# Patient Record
Sex: Male | Born: 2000 | Race: Black or African American | Hispanic: No | Marital: Single | Smoking: Light tobacco smoker
Health system: Southern US, Community
[De-identification: ages and names within clinical notes are randomized; demographics above are authoritative.]

---

## 2020-09-22 ENCOUNTER — Other Ambulatory Visit: Payer: Self-pay

## 2020-09-22 ENCOUNTER — Emergency Department (HOSPITAL_COMMUNITY)
Admission: EM | Admit: 2020-09-22 | Discharge: 2020-09-22 | Disposition: A | Discharge status: Home / Self Care | Payer: BC Managed Care – PPO | Attending: Emergency Medicine | Admitting: Emergency Medicine

## 2020-09-22 ENCOUNTER — Emergency Department (HOSPITAL_COMMUNITY): Payer: BC Managed Care – PPO

## 2020-09-22 ENCOUNTER — Encounter (HOSPITAL_COMMUNITY): Payer: Self-pay | Admitting: Emergency Medicine

## 2020-09-22 DIAGNOSIS — R0789 Other chest pain: Secondary | ICD-10-CM | POA: Diagnosis not present

## 2020-09-22 DIAGNOSIS — F17200 Nicotine dependence, unspecified, uncomplicated: Secondary | ICD-10-CM | POA: Insufficient documentation

## 2020-09-22 MED ORDER — METHOCARBAMOL 500 MG PO TABS: 500.0000 mg | ORAL_TABLET | Freq: Two times a day (BID) | ORAL | 0 refills | Status: AC

## 2020-09-22 MED ORDER — IBUPROFEN 200 MG PO TABS
600.0000 mg | ORAL_TABLET | Freq: Once | ORAL | Status: AC
  Administered 2020-09-22: 600 mg via ORAL
  Filled 2020-09-22: qty 3

## 2020-09-22 MED ORDER — ACETAMINOPHEN 325 MG PO TABS
650.0000 mg | ORAL_TABLET | Freq: Once | ORAL | Status: AC
  Administered 2020-09-22: 650 mg via ORAL
  Filled 2020-09-22: qty 2

## 2020-09-22 MED ORDER — NAPROXEN 500 MG PO TABS: 500.0000 mg | ORAL_TABLET | Freq: Two times a day (BID) | ORAL | 0 refills | Status: AC

## 2020-09-22 NOTE — ED Provider Notes (Signed)
MSE was initiated and I personally evaluated the patient and placed orders (if any) at  1:42 PM on September 22, 2020.  The patient appears stable so that the remainder of the MSE may be completed by another provider.  Patient presents with right-sided chest pain ongoing for 3 days described as aching sharp and nonradiating.  Worse when he stretches out his chest or takes a deep breath or moves a certain way.  Denies fevers cough vomiting or diarrhea.  Cardiac history or any other medical history per patient.  On exam patient is awake and alert no tenderness to right chest wall.  Pain elicited however when he twists his upper torso to the right or to the left.  No lower extremity swelling noted.     Cheryll Cockayne, MD 09/22/20 [phone]

## 2020-09-22 NOTE — ED Provider Notes (Signed)
[facility]-EMERGENCY DEPT Provider Note   CSN: [id number] Arrival date & time: 09/22/20  1302    History Chief Complaint  Patient presents with  . chest wall pain     Frank Richard is a 20 y.o. male with no significant past medical history who presents for evaluation of right sided chest wall pain. Began 3 days ago. Worse with movement, twisting, reaching out right arm. No recent head trauma.  Does not radiate into the back, neck or jaw.  He has no exertional chest pain.  Has not taken anything for his symptoms.  No swelling to chest wall, rashes or lesions.  Described as sharp and aching.  No cardiac history.  No syncope.  No fever, chills, nausea, vomiting, cough, hemoptysis, shortness of breath abdominal pain, paresthesias, weakness, lateral leg swelling, redness or warmth.  No prior history of PE or DVT.  No recent surgery, immobilization or malignancy.  Pain is not worse when he lays flat or sits up.  Rates his pain a 9/10.  Denies additional aggravating or alleviating factors.  History obtained from patient and past medical history. No interpretor was used.  HPI     History reviewed. No pertinent past medical history.  There are no problems to display for this patient.   History reviewed. No pertinent surgical history.     No family history on file.  Social History   Tobacco Use  . Smoking status: Light Tobacco Smoker  . Smokeless tobacco: Never Used  Substance Use Topics  . Alcohol use: Never  . Drug use: Yes    Types: Marijuana    Comment: daily    Home Medications Prior to Admission medications   Medication Sig Start Date End Date Taking? Authorizing Provider  methocarbamol (ROBAXIN) 500 MG tablet Take 1 tablet (500 mg total) by mouth 2 (two) times daily. 09/22/20  Yes Teasia Zapf A, PA-C  naproxen (NAPROSYN) 500 MG tablet Take 1 tablet (500 mg total) by mouth 2 (two) times daily. 09/22/20  Yes Darriel Utter A, PA-C     Allergies    Patient has no known allergies.  Review of Systems   Review of Systems  Constitutional: Negative.   HENT: Negative.   Respiratory: Negative.   Cardiovascular: Positive for chest pain. Negative for palpitations and leg swelling.  Gastrointestinal: Negative.   Genitourinary: Negative.   Musculoskeletal: Negative.   Skin: Negative.   Neurological: Negative.   All other systems reviewed and are negative.   Physical Exam Updated Vital Signs BP 118/75 (BP Location: Right Arm)   Pulse 69   Temp 98 F (36.7 C) (Oral)   Resp 18   Ht 5\' 9"  (1.753 m)   Wt 61.2 kg   SpO2 99%   BMI 19.94 kg/m   Physical Exam Vitals and nursing note reviewed.  Constitutional:      General: He is not in acute distress.    Appearance: He is not ill-appearing, toxic-appearing or diaphoretic.  HENT:     Head: Normocephalic and atraumatic.     Jaw: There is normal jaw occlusion.     Nose: Nose normal.     Mouth/Throat:     Mouth: Mucous membranes are moist.  Eyes:     Extraocular Movements: Extraocular movements intact.  Neck:     Vascular: No carotid bruit or JVD.     Trachea: Trachea and phonation normal.     Meningeal: Brudzinski's sign and Kernig's sign absent.  Cardiovascular:  Rate and Rhythm: Normal rate.     Pulses: Normal pulses.          Radial pulses are 2+ on the right side and 2+ on the left side.       Posterior tibial pulses are 2+ on the right side and 2+ on the left side.     Heart sounds: Normal heart sounds.  Pulmonary:     Effort: Pulmonary effort is normal.     Breath sounds: Normal breath sounds and air entry.     Comments: Clear to auscultation bilaterally.  Speaks in full sentences without difficulty. Chest:     Chest wall: No deformity, swelling, tenderness, crepitus or edema.  Abdominal:     General: Bowel sounds are normal. There is no distension.     Palpations: Abdomen is soft.     Tenderness: There is no abdominal tenderness. There is no  guarding or rebound.     Comments: Soft, nontender.  No rebound or guarding.  Musculoskeletal:        General: No swelling or tenderness. Normal range of motion.     Cervical back: Full passive range of motion without pain, normal range of motion and neck supple.     Right lower leg: No edema.     Left lower leg: No edema.     Comments: No bony tenderness to all 4 extremities.  Compartments soft.  Denna Haggard' sign negative.  Feet:     Right foot:     Skin integrity: Skin integrity normal.     Left foot:     Skin integrity: Skin integrity normal.  Skin:    General: Skin is warm.     Capillary Refill: Capillary refill takes less than 2 seconds.     Comments: No edema, erythema or warmth.  No fluctuance or induration.  Neurological:     General: No focal deficit present.     Mental Status: He is alert and oriented to person, place, and time.     Comments: Cranial nerves II through grossly intact Moves all 4 extremities at difficulty Ambulatory without difficulty     ED Results / Procedures / Treatments   Labs (all labs ordered are listed, but only abnormal results are displayed) Labs Reviewed - No data to display  EKG EKG Interpretation  Date/Time:  Wednesday September 22 2020 13:52:45 EDT Ventricular Rate:  57 PR Interval:  187 QRS Duration: 82 QT Interval:  383 QTC Calculation: 373 R Axis:   92 Text Interpretation: Sinus rhythm Borderline right axis deviation RSR' in V1 or V2, probably normal variant early repolarization, no old comparison Confirmed by Arby Barrette [phone]) on 09/22/2020 3:36:40 PM   Radiology DG Chest 2 View  Result Date: 09/22/2020 CLINICAL DATA:  Chest pain. EXAM: CHEST - 2 VIEW COMPARISON:  None. FINDINGS: The heart size and mediastinal contours are within normal limits. Both lungs are clear. No pneumothorax or pleural effusion is noted. The visualized skeletal structures are unremarkable. IMPRESSION: No active cardiopulmonary disease. Electronically Signed    By: Lupita Raider M.D.   On: 09/22/2020 15:06   Procedures Procedures   Medications Ordered in ED Medications  acetaminophen (TYLENOL) tablet 650 mg (650 mg Oral Given 09/22/20 1505)  ibuprofen (ADVIL) tablet 600 mg (600 mg Oral Given 09/22/20 1505)   ED Course  I have reviewed the triage vital signs and the nursing notes.  Pertinent labs & imaging results that were available during my care of the patient were reviewed by  me and considered in my medical decision making (see chart for details).  20 year old here for evaluation of right-sided chest pain over the last 3 days.  No recent injury or trauma.  He is afebrile, nonseptic, not ill-appearing.  Pain worse with movement.  Does not radiate into the arm, back, jaw.  He has no clinical evidence of DVT on exam.  He is PERC negative, Wells criteria low risk.  Low suspicion for acute ACS given this is a young healthy male with no cardiovascular risk factors.  Denies any PND, orthopnea, history of heart failure.  No history of drug use, specifically cocaine use.  He has no clinical evidence of fluid overload on exam.  He has no pain when he lies flat or sits forward.  Low suspicion for acute pericarditis.   Dg chest without acute cardiomegaly, pulm edema, pneumothorax, infiltrates EKG with likely early repolarization  Patient given Tylenol and ibuprofen here with significant relief of pain. I am able to reproduce his pain on exam.  Likely musculoskeletal in etiology.  He has no overlying skin changes to suggest cellulitis or abscess.  Low suspicion for PE, dissection.  DC home with symptomatic management.  He will return for any worsening symptoms  The patient has been appropriately medically screened and/or stabilized in the ED. I have low suspicion for any other emergent medical condition which would require further screening, evaluation or treatment in the ED or require inpatient management.  Patient is hemodynamically stable and in no  acute distress.  Patient able to ambulate in department prior to ED.  Evaluation does not show acute pathology that would require ongoing or additional emergent interventions while in the emergency department or further inpatient treatment.  I have discussed the diagnosis with the patient and answered all questions.  Pain is been managed while in the emergency department and patient has no further complaints prior to discharge.  Patient is comfortable with plan discussed in room and is stable for discharge at this time.  I have discussed strict return precautions for returning to the emergency department.  Patient was encouraged to follow-up with PCP/specialist refer to at discharge.       MDM Rules/Calculators/A&P                           Final Clinical Impression(s) / ED Diagnoses Final diagnoses:  Chest wall pain    Rx / DC Orders ED Discharge Orders         Ordered    naproxen (NAPROSYN) 500 MG tablet  2 times daily        09/22/20 1541    methocarbamol (ROBAXIN) 500 MG tablet  2 times daily        09/22/20 1541           Ustin Cruickshank A, PA-C 09/22/20 1553    Arby Barrette, MD 09/22/20 [phone]

## 2020-09-22 NOTE — Discharge Instructions (Signed)
Take the medications as prescribed  Return for new or worsening symptoms 

## 2020-09-22 NOTE — ED Triage Notes (Signed)
Patient complains of R sided chest wall pain, endorses pain around his pectoral muscle with arm movement. Denies recent trauma, lifting, car wrecks, denies shortness of breath cough or chest pain.

## 2020-09-25 ENCOUNTER — Emergency Department (HOSPITAL_COMMUNITY)
Admission: EM | Admit: 2020-09-25 | Discharge: 2020-09-25 | Disposition: A | Discharge status: Home / Self Care | Payer: BC Managed Care – PPO | Attending: Emergency Medicine | Admitting: Emergency Medicine

## 2020-09-25 ENCOUNTER — Encounter (HOSPITAL_COMMUNITY): Payer: Self-pay

## 2020-09-25 ENCOUNTER — Emergency Department (HOSPITAL_COMMUNITY)
Admission: EM | Admit: 2020-09-25 | Discharge: 2020-09-25 | Disposition: A | Discharge status: Home / Self Care | Payer: BC Managed Care – PPO | Source: Home / Self Care | Attending: Emergency Medicine | Admitting: Emergency Medicine

## 2020-09-25 ENCOUNTER — Encounter (HOSPITAL_COMMUNITY): Payer: Self-pay | Admitting: Emergency Medicine

## 2020-09-25 ENCOUNTER — Other Ambulatory Visit: Payer: Self-pay

## 2020-09-25 ENCOUNTER — Emergency Department (HOSPITAL_COMMUNITY): Payer: BC Managed Care – PPO

## 2020-09-25 DIAGNOSIS — R079 Chest pain, unspecified: Secondary | ICD-10-CM | POA: Insufficient documentation

## 2020-09-25 DIAGNOSIS — Z20822 Contact with and (suspected) exposure to covid-19: Secondary | ICD-10-CM | POA: Insufficient documentation

## 2020-09-25 DIAGNOSIS — Z28310 Unvaccinated for covid-19: Secondary | ICD-10-CM | POA: Diagnosis not present

## 2020-09-25 DIAGNOSIS — R519 Headache, unspecified: Secondary | ICD-10-CM | POA: Insufficient documentation

## 2020-09-25 DIAGNOSIS — R059 Cough, unspecified: Secondary | ICD-10-CM | POA: Insufficient documentation

## 2020-09-25 DIAGNOSIS — R1013 Epigastric pain: Secondary | ICD-10-CM | POA: Diagnosis not present

## 2020-09-25 DIAGNOSIS — D72829 Elevated white blood cell count, unspecified: Secondary | ICD-10-CM | POA: Insufficient documentation

## 2020-09-25 DIAGNOSIS — M791 Myalgia, unspecified site: Secondary | ICD-10-CM | POA: Insufficient documentation

## 2020-09-25 DIAGNOSIS — J020 Streptococcal pharyngitis: Secondary | ICD-10-CM | POA: Insufficient documentation

## 2020-09-25 DIAGNOSIS — F17200 Nicotine dependence, unspecified, uncomplicated: Secondary | ICD-10-CM | POA: Insufficient documentation

## 2020-09-25 DIAGNOSIS — J029 Acute pharyngitis, unspecified: Secondary | ICD-10-CM | POA: Insufficient documentation

## 2020-09-25 DIAGNOSIS — R52 Pain, unspecified: Secondary | ICD-10-CM

## 2020-09-25 DIAGNOSIS — K920 Hematemesis: Secondary | ICD-10-CM | POA: Diagnosis not present

## 2020-09-25 DIAGNOSIS — E876 Hypokalemia: Secondary | ICD-10-CM | POA: Diagnosis not present

## 2020-09-25 DIAGNOSIS — R070 Pain in throat: Secondary | ICD-10-CM | POA: Diagnosis present

## 2020-09-25 LAB — CBC WITH DIFFERENTIAL/PLATELET
Abs Immature Granulocytes: 0.03 10*3/uL (ref 0.00–0.07)
Abs Immature Granulocytes: 0.06 10*3/uL (ref 0.00–0.07)
Basophils Absolute: 0 10*3/uL (ref 0.0–0.1)
Basophils Absolute: 0 10*3/uL (ref 0.0–0.1)
Basophils Relative: 0 %
Basophils Relative: 0 %
Eosinophils Absolute: 0 10*3/uL (ref 0.0–0.5)
Eosinophils Absolute: 0 10*3/uL (ref 0.0–0.5)
Eosinophils Relative: 0 %
Eosinophils Relative: 0 %
HCT: 39.8 % (ref 39.0–52.0)
HCT: 37 % — ABNORMAL LOW (ref 39.0–52.0)
Hemoglobin: 13.3 g/dL (ref 13.0–17.0)
Hemoglobin: 12.7 g/dL — ABNORMAL LOW (ref 13.0–17.0)
Immature Granulocytes: 0 %
Immature Granulocytes: 0 %
Lymphocytes Relative: 5 %
Lymphocytes Relative: 3 %
Lymphs Abs: 0.5 10*3/uL — ABNORMAL LOW (ref 0.7–4.0)
Lymphs Abs: 0.4 10*3/uL — ABNORMAL LOW (ref 0.7–4.0)
MCH: 29.1 pg (ref 26.0–34.0)
MCH: 29.4 pg (ref 26.0–34.0)
MCHC: 33.4 g/dL (ref 30.0–36.0)
MCHC: 34.3 g/dL (ref 30.0–36.0)
MCV: 87.1 fL (ref 80.0–100.0)
MCV: 85.6 fL (ref 80.0–100.0)
Monocytes Absolute: 0.8 10*3/uL (ref 0.1–1.0)
Monocytes Absolute: 1 10*3/uL (ref 0.1–1.0)
Monocytes Relative: 8 %
Monocytes Relative: 7 %
Neutro Abs: 9 10*3/uL — ABNORMAL HIGH (ref 1.7–7.7)
Neutro Abs: 12.1 10*3/uL — ABNORMAL HIGH (ref 1.7–7.7)
Neutrophils Relative %: 87 %
Neutrophils Relative %: 90 %
Platelets: 213 10*3/uL (ref 150–400)
Platelets: 188 10*3/uL (ref 150–400)
RBC: 4.57 MIL/uL (ref 4.22–5.81)
RBC: 4.32 MIL/uL (ref 4.22–5.81)
RDW: 12.7 % (ref 11.5–15.5)
RDW: 12.4 % (ref 11.5–15.5)
WBC: 10.4 10*3/uL (ref 4.0–10.5)
WBC: 13.5 10*3/uL — ABNORMAL HIGH (ref 4.0–10.5)
nRBC: 0 % (ref 0.0–0.2)
nRBC: 0 % (ref 0.0–0.2)

## 2020-09-25 LAB — COMPREHENSIVE METABOLIC PANEL
ALT: 29 U/L (ref 0–44)
ALT: 27 U/L (ref 0–44)
AST: 26 U/L (ref 15–41)
AST: 24 U/L (ref 15–41)
Albumin: 4.2 g/dL (ref 3.5–5.0)
Albumin: 4.3 g/dL (ref 3.5–5.0)
Alkaline Phosphatase: 40 U/L (ref 38–126)
Alkaline Phosphatase: 44 U/L (ref 38–126)
Anion gap: 11 (ref 5–15)
Anion gap: 9 (ref 5–15)
BUN: 18 mg/dL (ref 6–20)
BUN: 16 mg/dL (ref 6–20)
CO2: 21 mmol/L — ABNORMAL LOW (ref 22–32)
CO2: 23 mmol/L (ref 22–32)
Calcium: 8.8 mg/dL — ABNORMAL LOW (ref 8.9–10.3)
Calcium: 8.7 mg/dL — ABNORMAL LOW (ref 8.9–10.3)
Chloride: 104 mmol/L (ref 98–111)
Chloride: 105 mmol/L (ref 98–111)
Creatinine, Ser: 0.83 mg/dL (ref 0.61–1.24)
Creatinine, Ser: 0.77 mg/dL (ref 0.61–1.24)
GFR, Estimated: >60 mL/min (ref >60)
GFR, Estimated: >60 mL/min (ref >60)
Glucose, Bld: 101 mg/dL — ABNORMAL HIGH (ref 70–99)
Glucose, Bld: 99 mg/dL (ref 70–99)
Potassium: 3.8 mmol/L (ref 3.5–5.1)
Potassium: 3.2 mmol/L — ABNORMAL LOW (ref 3.5–5.1)
Sodium: 136 mmol/L (ref 135–145)
Sodium: 137 mmol/L (ref 135–145)
Total Bilirubin: 0.5 mg/dL (ref 0.3–1.2)
Total Bilirubin: 0.9 mg/dL (ref 0.3–1.2)
Total Protein: 6.7 g/dL (ref 6.5–8.1)
Total Protein: 7 g/dL (ref 6.5–8.1)

## 2020-09-25 LAB — LIPASE, BLOOD: Lipase: 21 U/L (ref 11–51)

## 2020-09-25 LAB — RESP PANEL BY RT-PCR (FLU A&B, COVID) ARPGX2
Influenza A by PCR: NEGATIVE
Influenza B by PCR: NEGATIVE
SARS Coronavirus 2 by RT PCR: NEGATIVE

## 2020-09-25 LAB — TROPONIN I (HIGH SENSITIVITY)
Troponin I (High Sensitivity): <2 ng/L (ref <18)
Troponin I (High Sensitivity): <2 ng/L (ref <18)

## 2020-09-25 LAB — GROUP A STREP BY PCR: Group A Strep by PCR: DETECTED — AB

## 2020-09-25 LAB — D-DIMER, QUANTITATIVE: D-Dimer, Quant: <0.27 ug/mL-FEU (ref 0.00–0.50)

## 2020-09-25 LAB — POC OCCULT BLOOD, ED: Fecal Occult Bld: NEGATIVE

## 2020-09-25 MED ORDER — SODIUM CHLORIDE 0.9 % IV BOLUS
1000.0000 mL | Freq: Once | INTRAVENOUS | Status: AC
  Administered 2020-09-25: 1000 mL via INTRAVENOUS

## 2020-09-25 MED ORDER — KETOROLAC TROMETHAMINE 30 MG/ML IJ SOLN
30.0000 mg | Freq: Once | INTRAMUSCULAR | Status: AC
  Administered 2020-09-25: 30 mg via INTRAVENOUS
  Filled 2020-09-25: qty 1

## 2020-09-25 MED ORDER — CEPHALEXIN 500 MG PO CAPS: 500.0000 mg | ORAL_CAPSULE | Freq: Two times a day (BID) | ORAL | 0 refills | Status: AC

## 2020-09-25 MED ORDER — CEPHALEXIN 500 MG PO CAPS
500.0000 mg | ORAL_CAPSULE | Freq: Once | ORAL | Status: AC
  Administered 2020-09-25: 500 mg via ORAL
  Filled 2020-09-25: qty 1

## 2020-09-25 MED ORDER — OMEPRAZOLE 20 MG PO CPDR: 40.0000 mg | DELAYED_RELEASE_CAPSULE | Freq: Every day | ORAL | 0 refills | Status: AC

## 2020-09-25 MED ORDER — ACETAMINOPHEN 325 MG PO TABS
650.0000 mg | ORAL_TABLET | Freq: Once | ORAL | Status: AC
  Administered 2020-09-25: 650 mg via ORAL
  Filled 2020-09-25: qty 2

## 2020-09-25 NOTE — ED Provider Notes (Signed)
[facility]-EMERGENCY DEPT Provider Note   CSN: [id number] Arrival date & time: 09/25/20  1359     History Chief Complaint  Patient presents with  . Shortness of Breath    Frank Richard is a 20 y.o. male who presents with concern for sore throat, myalgias, headache, nausea, vomiting, and 2 episodes of hematemesis. Symptoms began on 4/21, after  His evaluation on 4/20 for chest wall pain. He has been seen in the emergency department in the 24 hours for similar complaints, however hematemesis is new this afternoon.  Most recently he was seen this morning around 2 AM.  Laboratory studies including D-dimer were normal at that time.  He does endorse worsening shortness of breath, central chest pain, nausea, and myalgias at this time.  Epigastric pain worsened with deep inspiration. He does endorse using NSAIDs heavily the last 3 days, but not normally at baseline for him.  Patient not vaccinating his COVID-19.  Patient states he is taking naproxen, and has been taking it on an empty stomach. He does not usually take NSAID medications.   I personally reviewed this patient's medical records.  He is an otherwise healthy 20 year old male without any medical diagnoses.  He is not on any medications every day.  HPI     History reviewed. No pertinent past medical history.  There are no problems to display for this patient.   History reviewed. No pertinent surgical history.     History reviewed. No pertinent family history.  Social History   Tobacco Use  . Smoking status: Light Tobacco Smoker  . Smokeless tobacco: Never Used  Substance Use Topics  . Alcohol use: Never  . Drug use: Yes    Types: Marijuana    Comment: daily    Home Medications Prior to Admission medications   Medication Sig Start Date End Date Taking? Authorizing Provider  cephALEXin (KEFLEX) 500 MG capsule Take 1 capsule (500 mg total) by mouth 2 (two) times daily for 10 days. 09/25/20  10/05/20 Yes Jacaden Forbush, Eugene Gavia, PA-C  methocarbamol (ROBAXIN) 500 MG tablet Take 1 tablet (500 mg total) by mouth 2 (two) times daily. 09/22/20  Yes Henderly, Britni A, PA-C  naproxen (NAPROSYN) 500 MG tablet Take 1 tablet (500 mg total) by mouth 2 (two) times daily. 09/22/20  Yes Henderly, Britni A, PA-C  omeprazole (PRILOSEC) 20 MG capsule Take 2 capsules (40 mg total) by mouth daily for 14 days. 09/25/20 10/09/20 Yes Prudie Guthridge, Eugene Gavia, PA-C    Allergies    Patient has no known allergies.  Review of Systems   Review of Systems  Constitutional: Positive for activity change, appetite change, chills, fatigue and fever.  HENT: Positive for congestion and sore throat. Negative for trouble swallowing and voice change.   Respiratory: Positive for shortness of breath. Negative for chest tightness.   Cardiovascular: Positive for chest pain. Negative for palpitations and leg swelling.  Gastrointestinal: Positive for abdominal pain, nausea and vomiting. Negative for blood in stool, constipation and diarrhea.  Genitourinary: Negative for difficulty urinating, dysuria, hematuria, penile pain, penile swelling, scrotal swelling, testicular pain and urgency.  Musculoskeletal: Positive for myalgias. Negative for neck pain and neck stiffness.  Skin: Negative.   Neurological: Positive for headaches. Negative for dizziness, tremors, syncope, facial asymmetry, weakness and light-headedness.    Physical Exam Updated Vital Signs BP 118/73 (BP Location: Right Arm)   Pulse 85   Temp 98.6 F (37 C) (Oral)   Resp 20   SpO2 99%  Physical Exam Vitals and nursing note reviewed. Exam conducted with a chaperone present.  Constitutional:      Appearance: He is normal weight. He is ill-appearing. He is not toxic-appearing.  HENT:     Head: Normocephalic and atraumatic.     Nose: Nose normal.     Mouth/Throat:     Mouth: Mucous membranes are moist.     Pharynx: Oropharynx is clear. Uvula midline. Posterior  oropharyngeal erythema present. No oropharyngeal exudate or uvula swelling.     Tonsils: No tonsillar exudate or tonsillar abscesses. 2+ on the right. 2+ on the left.  Eyes:     General: Lids are normal. Vision grossly intact.        Right eye: No discharge.        Left eye: No discharge.     Extraocular Movements: Extraocular movements intact.     Conjunctiva/sclera: Conjunctivae normal.     Pupils: Pupils are equal, round, and reactive to light.  Neck:     Trachea: Trachea and phonation normal.     Meningeal: Brudzinski's sign and Kernig's sign absent.  Cardiovascular:     Rate and Rhythm: Normal rate and regular rhythm.     Pulses: Normal pulses.     Heart sounds: Normal heart sounds. No murmur heard.   Pulmonary:     Effort: Pulmonary effort is normal. No tachypnea, bradypnea, accessory muscle usage, prolonged expiration or respiratory distress.     Breath sounds: Normal breath sounds. No wheezing or rales.  Chest:     Chest wall: Tenderness present. No mass, lacerations, deformity, swelling, crepitus or edema.    Abdominal:     General: Bowel sounds are normal. There is no distension.     Palpations: Abdomen is soft.     Tenderness: There is generalized abdominal tenderness and tenderness in the epigastric area. There is guarding. There is no right CVA tenderness, left CVA tenderness or rebound.  Genitourinary:    Rectum: Normal. Guaiac result negative. No tenderness.  Musculoskeletal:        General: No deformity.     Cervical back: Normal range of motion and neck supple. No rigidity or crepitus. No pain with movement, spinous process tenderness or muscular tenderness.     Right lower leg: No edema.     Left lower leg: No edema.  Lymphadenopathy:     Cervical: No cervical adenopathy.  Skin:    General: Skin is warm and dry.  Neurological:     General: No focal deficit present.     Mental Status: He is alert and oriented to person, place, and time. Mental status is at  baseline.     Sensory: Sensation is intact.     Motor: Motor function is intact.     Gait: Gait is intact.  Psychiatric:        Mood and Affect: Mood normal.     ED Results / Procedures / Treatments   Labs (all labs ordered are listed, but only abnormal results are displayed) Labs Reviewed  GROUP A STREP BY PCR - Abnormal; Notable for the following components:      Result Value   Group A Strep by PCR DETECTED (*)    All other components within normal limits  CBC WITH DIFFERENTIAL/PLATELET - Abnormal; Notable for the following components:   WBC 13.5 (*)    Hemoglobin 12.7 (*)    HCT 37.0 (*)    Neutro Abs 12.1 (*)    Lymphs Abs 0.4 (*)  All other components within normal limits  COMPREHENSIVE METABOLIC PANEL - Abnormal; Notable for the following components:   Potassium 3.2 (*)    Calcium 8.7 (*)    All other components within normal limits  LIPASE, BLOOD  URINALYSIS, ROUTINE W REFLEX MICROSCOPIC  POC OCCULT BLOOD, ED  TROPONIN I (HIGH SENSITIVITY)  TROPONIN I (HIGH SENSITIVITY)    EKG EKG Interpretation  Date/Time:  Saturday September 25 2020 15:53:19 EDT Ventricular Rate:  84 PR Interval:  186 QRS Duration: 84 QT Interval:  340 QTC Calculation: 402 R Axis:   93 Text Interpretation: Sinus rhythm Borderline right axis deviation No significant change since last tracing Confirmed by Alvira Monday ([id number]) on 09/25/2020 3:58:43 PM   Radiology DG Chest Portable 1 View  Result Date: 09/25/2020 CLINICAL DATA:  Sore throat, headache, vomiting and body aches EXAM: PORTABLE CHEST 1 VIEW COMPARISON:  September 22, 2020 FINDINGS: The heart size and mediastinal contours are within normal limits. Both lungs are clear. The visualized skeletal structures are unremarkable. IMPRESSION: No active disease. Electronically Signed   By: Maudry Mayhew MD   On: 09/25/2020 16:26    Procedures Procedures   Medications Ordered in ED Medications  sodium chloride 0.9 % bolus 1,000 mL (0 mLs  Intravenous Stopped 09/25/20 1840)  acetaminophen (TYLENOL) tablet 650 mg (650 mg Oral Given 09/25/20 1732)  cephALEXin (KEFLEX) capsule 500 mg (500 mg Oral Given 09/25/20 1827)    ED Course  I have reviewed the triage vital signs and the nursing notes.  Pertinent labs & imaging results that were available during my care of the patient were reviewed by me and considered in my medical decision making (see chart for details).    MDM Rules/Calculators/A&P                         20 year old male with 72 hours of myalgias, sore throat, headache, now with worsening epigastric pain and with single episode of hematemesis at home.  No history of the same.  Differential diagnosis includes is not limited to COVID-19, influenza A/B, strep throat, other acute viral illness, gastritis, GERD, peptic ulcer disease.  Vital signs are normal intake.  Cardiopulmonary exam is normal, abdominal exam with generalized tenderness palpation.  HEENT exam revealed erythematous posterior pharynx without tonsillar edema or exudate.  Patient is neurologically intact and there is no meningeal signs.  Despite underwhelming HEENT exam we will proceed with strep test in addition to basic laboratory studies.  Patient negative for COVID-19 and influenza A/B earlier today.  CBC with mild leukocytosis of 13,000, CMP with mild hypokalemia.  Lipase is negative, troponin is normal, fecal occult blood test negative.  Chest x-ray negative for acute cardiopulmonary disease.  EKG unremarkable.  Group A strep test positive.    Patient has received IV fluid resuscitation and first dose of  antibiotics in the emergency department.  Feeling better after Tylenol administration.  Suspect single episode of hematemesis like secondary to significant NSAID use in the past 72 hours, often on an empty stomach, with no history of NSAID use in the past.  Suspect acute gastritis secondary to NSAID overuse.  Will discharge with PPI prescription,  high-dose x 2 weeks.  We will also discharge with antibiotic coverage for strep pharyngitis.  No further work-up warranted in the ED at this time. Recommend close PCP follow up.   Terez voiced understanding of his medical evaluation and treatment plan.  Each of his questions was answered to  his expressed satisfaction.  Return precautions were given.  Patient is stable and appropriate for discharge at this time.  This chart was dictated using voice recognition software, Dragon. Despite the best efforts of this provider to proofread and correct errors, errors may still occur which can change documentation meaning.  Final Clinical Impression(s) / ED Diagnoses Final diagnoses:  Strep pharyngitis    Rx / DC Orders ED Discharge Orders         Ordered    cephALEXin (KEFLEX) 500 MG capsule  2 times daily        09/25/20 1821    omeprazole (PRILOSEC) 20 MG capsule  Daily        09/25/20 [address] Idelia Salm 09/25/20 1850    Alvira Monday, MD 09/28/20 1140

## 2020-09-25 NOTE — ED Triage Notes (Signed)
Pt arrived via POV, c/o sore throat, head ache, vomiting and body aches. Seen for same 4/20 & this morning with no change.

## 2020-09-25 NOTE — ED Triage Notes (Signed)
Pt c/o chest pain, sore throat and HA. Pt was seen on 4/20 for same. Pt states his sx haven't improved and wanted to be seen again. Denies fever, N/V/D.

## 2020-09-25 NOTE — Discharge Instructions (Signed)
You were seen in the ER today for your body aches, headache, nausea.  Your blood work, chest x-ray, and EKG were very reassuring.  You did test positive for strep throat.  You have been prescribed an antibiotic which you will take twice a day for the next 10 days until the antibiotics prescription is complete.    You also have gastritis, which is inflammation of the lining of your stomach, likely secondary to the naproxen you have been taking. To treat this you have been prescribed a medication called Prilosec which you should take daily for the next 14 days to help with your abdominal pain. Please do not take any ibuprofen or naproxen.    You may follow-up with your primary care doctor.  Return the emergency room if develop any worsening abdominal pain, nausea or vomiting does not stop, chest pain, or any other new severe symptoms.

## 2020-09-25 NOTE — Discharge Instructions (Addendum)
No clear cause of your symptoms was found tonight.  Your laboratory work-up, EKG, and COVID test were all normal.  Your chest x-ray from 3 days ago was also normal.  I suspect that your symptoms are likely viral in nature.  Please take Tylenol and Motrin for symptomatic relief.  Stay hydrated.  Get plenty of rest.  Return for new or worsening symptoms.  Please follow-up with your regular doctor if not improved in the next 5 days.

## 2020-09-25 NOTE — ED Provider Notes (Signed)
[facility]-EMERGENCY DEPT Provider Note   CSN: [id number] Arrival date & time: 09/25/20  0225     History Chief Complaint  Patient presents with  . Chest Pain  . Headache  . Sore Throat    Frank Richard is a 20 y.o. male.  Patient presents to the emergency department with a chief complaint of chest pain, cough, sore throat, headache, and body aches.  He was seen here on 4/20 for the same.  He now states that he thinks he has COVID.  He denies getting the vaccine.  Denies any measured fever.  Denies any successful treatments at home.  He denies productive cough.  The history is provided by the patient. No language interpreter was used.       History reviewed. No pertinent past medical history.  There are no problems to display for this patient.   History reviewed. No pertinent surgical history.     No family history on file.  Social History   Tobacco Use  . Smoking status: Light Tobacco Smoker  . Smokeless tobacco: Never Used  Substance Use Topics  . Alcohol use: Never  . Drug use: Yes    Types: Marijuana    Comment: daily    Home Medications Prior to Admission medications   Medication Sig Start Date End Date Taking? Authorizing Provider  methocarbamol (ROBAXIN) 500 MG tablet Take 1 tablet (500 mg total) by mouth 2 (two) times daily. 09/22/20   Henderly, Britni A, PA-C  naproxen (NAPROSYN) 500 MG tablet Take 1 tablet (500 mg total) by mouth 2 (two) times daily. 09/22/20   Henderly, Britni A, PA-C    Allergies    Patient has no known allergies.  Review of Systems   Review of Systems  All other systems reviewed and are negative.   Physical Exam Updated Vital Signs BP 118/73   Pulse 76   Temp 98.2 F (36.8 C) (Oral)   Resp 18   SpO2 99%   Physical Exam Vitals and nursing note reviewed.  Constitutional:      Appearance: He is well-developed.  HENT:     Head: Normocephalic and atraumatic.  Eyes:     Conjunctiva/sclera:  Conjunctivae normal.  Cardiovascular:     Rate and Rhythm: Normal rate and regular rhythm.     Heart sounds: No murmur heard.   Pulmonary:     Effort: Pulmonary effort is normal. No respiratory distress.     Breath sounds: Normal breath sounds.     Comments: Anterior chest wall tenderness Chest:     Chest wall: Tenderness present.  Abdominal:     Palpations: Abdomen is soft.     Tenderness: There is no abdominal tenderness.  Musculoskeletal:     Cervical back: Neck supple.  Skin:    General: Skin is warm and dry.  Neurological:     Mental Status: He is alert and oriented to person, place, and time.  Psychiatric:        Mood and Affect: Mood normal.        Behavior: Behavior normal.     ED Results / Procedures / Treatments   Labs (all labs ordered are listed, but only abnormal results are displayed) Labs Reviewed  RESP PANEL BY RT-PCR (FLU A&B, COVID) ARPGX2    EKG None  Radiology No results found.  Procedures Procedures   Medications Ordered in ED Medications - No data to display  ED Course  I have reviewed the triage vital signs and  the nursing notes.  Pertinent labs & imaging results that were available during my care of the patient were reviewed by me and considered in my medical decision making (see chart for details).    MDM Rules/Calculators/A&P                          Patient here with body aches, chest pain, sore throat, headache.  Symptoms seem consistent with COVID.  Will check COVID test here.  Chest x-ray reviewed from 3 days ago, was normal.  I have low suspicion for pneumothorax, breath sounds heard bilaterally.  I have low suspicion for PE, he is not hypoxic, nor tachycardic.  D-dimer is negative.  Low risk for ACS given age and lack of risk factors.  EKG shows no acute ischemic changes.   Laboratory work-up is reassuring.  No leukocytosis.  No electrolyte derangement.  Patient given a liter of fluid.  I discussed the results with the  patient and his mother.  No clear explanation for the patient's symptoms have been found, but no emergent cause identified.  Return precautions discussed. Final Clinical Impression(s) / ED Diagnoses Final diagnoses:  Body aches    Rx / DC Orders ED Discharge Orders    None       Roxy Horseman, Cordelia Poche 09/25/20 [id number]    Nira Conn, MD 09/25/20 [phone]

## 2021-02-07 ENCOUNTER — Other Ambulatory Visit: Payer: Self-pay

## 2021-02-07 ENCOUNTER — Emergency Department (HOSPITAL_COMMUNITY)
Admission: EM | Admit: 2021-02-07 | Discharge: 2021-02-07 | Disposition: A | Discharge status: Home / Self Care | Payer: BC Managed Care – PPO | Attending: Emergency Medicine | Admitting: Emergency Medicine

## 2021-02-07 ENCOUNTER — Encounter (HOSPITAL_COMMUNITY): Payer: Self-pay

## 2021-02-07 DIAGNOSIS — Z20822 Contact with and (suspected) exposure to covid-19: Secondary | ICD-10-CM | POA: Diagnosis not present

## 2021-02-07 DIAGNOSIS — J069 Acute upper respiratory infection, unspecified: Secondary | ICD-10-CM | POA: Insufficient documentation

## 2021-02-07 DIAGNOSIS — J029 Acute pharyngitis, unspecified: Secondary | ICD-10-CM | POA: Diagnosis present

## 2021-02-07 DIAGNOSIS — M791 Myalgia, unspecified site: Secondary | ICD-10-CM | POA: Diagnosis not present

## 2021-02-07 DIAGNOSIS — F17200 Nicotine dependence, unspecified, uncomplicated: Secondary | ICD-10-CM | POA: Diagnosis not present

## 2021-02-07 LAB — RESP PANEL BY RT-PCR (FLU A&B, COVID) ARPGX2
Influenza A by PCR: NEGATIVE
Influenza B by PCR: NEGATIVE
SARS Coronavirus 2 by RT PCR: NEGATIVE

## 2021-02-07 LAB — GROUP A STREP BY PCR: Group A Strep by PCR: NOT DETECTED

## 2021-02-07 MED ORDER — FLUTICASONE PROPIONATE 50 MCG/ACT NA SUSP: 2.0000 | Freq: Every day | NASAL | 0 refills | Status: AC

## 2021-02-07 MED ORDER — PROMETHAZINE-DM 6.25-15 MG/5ML PO SYRP: 5.0000 mL | ORAL_SOLUTION | Freq: Four times a day (QID) | ORAL | 0 refills | Status: AC | PRN

## 2021-02-07 MED ORDER — ACETAMINOPHEN 500 MG PO TABS
1000.0000 mg | ORAL_TABLET | Freq: Once | ORAL | Status: AC
  Administered 2021-02-07: 1000 mg via ORAL
  Filled 2021-02-07: qty 2

## 2021-02-07 MED ORDER — BENZONATATE 100 MG PO CAPS: 100.0000 mg | ORAL_CAPSULE | Freq: Three times a day (TID) | ORAL | 0 refills | Status: AC

## 2021-02-07 NOTE — Discharge Instructions (Addendum)
Viral Illness TREATMENT   Please use Tylenol or ibuprofen for pain.  You may use 600 mg ibuprofen every 6 hours or 1000 mg of Tylenol every 6 hours.  You may choose to alternate between the 2.  This would be most effective.  Not to exceed 4 g of Tylenol within 24 hours.  Not to exceed 3200 mg ibuprofen 24 hours.    Treatment is directed at relieving symptoms. There is no cure. Antibiotics are not effective, because the infection is caused by a virus, not by bacteria. Treatment may include:  Increased fluid intake. Sports drinks offer valuable electrolytes, sugars, and fluids.  Breathing heated mist or steam (vaporizer or shower).  Eating chicken soup or other clear broths, and maintaining good nutrition.  Getting plenty of rest.  Using gargles or lozenges for comfort.  Increasing usage of your inhaler if you have asthma.  Return to work when your temperature has returned to normal.  Gargle warm salt water and spit it out for sore throat. Take benadryl to decrease sinus secretions. Continue to alternate between Tylenol and ibuprofen for pain and fever control.  Follow Up: Follow up with your primary care doctor in 5-7 days for recheck of ongoing symptoms.  Return to emergency department for emergent changing or worsening of symptoms.   

## 2021-02-07 NOTE — ED Triage Notes (Signed)
Pt reports waking up today with sore throat, cough, chills and generalized body aches.

## 2021-02-07 NOTE — ED Provider Notes (Signed)
[facility]-EMERGENCY DEPT Provider Note   CSN: [id number] Arrival date & time: 02/07/21  2115     History Chief Complaint  Patient presents with   Sore Throat    Frank Richard is a 20 y.o. male.  HPI Patient is 20 year old male with past medical history detailed in HPI presented today for sore throat cough congestion body aches that began today.  He has taken no medications for his symptoms he came to the ER for evaluation.  He is vaccinated x3 he thinks and certainly has been vaccinated x2.  He states his most prominent symptom is a sore throat he states it is achy constant worse with swallowing states he has no difficulty talking and eating however.  Denies any fevers or chills.  No chest pain shortness of breath or vomiting.     History reviewed. No pertinent past medical history.  There are no problems to display for this patient.   History reviewed. No pertinent surgical history.     No family history on file.  Social History   Tobacco Use   Smoking status: Light Smoker   Smokeless tobacco: Never  Substance Use Topics   Alcohol use: Never   Drug use: Yes    Types: Marijuana    Comment: daily    Home Medications Prior to Admission medications   Medication Sig Start Date End Date Taking? Authorizing Provider  benzonatate (TESSALON) 100 MG capsule Take 1 capsule (100 mg total) by mouth every 8 (eight) hours. 02/07/21  Yes Graycee Greeson S, PA  fluticasone (FLONASE) 50 MCG/ACT nasal spray Place 2 sprays into both nostrils daily for 14 days. 02/07/21 02/21/21 Yes Seyon Strader S, PA  fluticasone (FLONASE) 50 MCG/ACT nasal spray Place 2 sprays into both nostrils daily for 14 days. 02/07/21 02/21/21 Yes Nazim Kadlec S, PA  promethazine-dextromethorphan (PROMETHAZINE-DM) 6.25-15 MG/5ML syrup Take 5 mLs by mouth 4 (four) times daily as needed for cough. 02/07/21  Yes Fantashia Shupert S, PA  methocarbamol (ROBAXIN) 500 MG tablet Take 1 tablet (500 mg  total) by mouth 2 (two) times daily. 09/22/20   Henderly, Britni A, PA-C  naproxen (NAPROSYN) 500 MG tablet Take 1 tablet (500 mg total) by mouth 2 (two) times daily. 09/22/20   Henderly, Britni A, PA-C  omeprazole (PRILOSEC) 20 MG capsule Take 2 capsules (40 mg total) by mouth daily for 14 days. 09/25/20 10/09/20  Sponseller, Eugene Gavia, PA-C    Allergies    Patient has no known allergies.  Review of Systems   Review of Systems  Constitutional:  Positive for chills and fatigue. Negative for fever.  HENT:  Positive for postnasal drip, rhinorrhea and sore throat. Negative for congestion.   Eyes:  Negative for redness.  Respiratory:  Positive for cough. Negative for shortness of breath.   Cardiovascular:  Negative for chest pain and leg swelling.  Gastrointestinal:  Positive for diarrhea, nausea and vomiting. Negative for abdominal distention and abdominal pain.  Endocrine: Negative for polyphagia.  Genitourinary:  Negative for dysuria.  Musculoskeletal:  Positive for myalgias.  Skin:  Negative for rash.  Neurological:  Negative for dizziness, syncope and headaches.  Psychiatric/Behavioral:  Negative for confusion.    Physical Exam Updated Vital Signs BP 102/69 (BP Location: Left Arm)   Pulse 87   Temp 98.4 F (36.9 C) (Oral)   Resp 14   Ht 5\' 9"  (1.753 m)   Wt 61.2 kg   SpO2 100%   BMI 19.94 kg/m   Physical  Exam Vitals and nursing note reviewed.  Constitutional:      General: He is not in acute distress.    Comments: Pleasant well-appearing 20 year old.  In no acute distress.  Sitting comfortably in bed.  Able answer questions appropriately follow commands. No increased work of breathing. Speaking in full sentences.   HENT:     Head: Normocephalic and atraumatic.     Nose: Nose normal.  Eyes:     General: No scleral icterus. Cardiovascular:     Rate and Rhythm: Normal rate and regular rhythm.     Pulses: Normal pulses.     Heart sounds: Normal heart sounds.  Pulmonary:      Effort: Pulmonary effort is normal. No respiratory distress.     Breath sounds: No wheezing.  Abdominal:     Palpations: Abdomen is soft.     Tenderness: There is no abdominal tenderness.  Musculoskeletal:     Cervical back: Normal range of motion.     Right lower leg: No edema.     Left lower leg: No edema.  Skin:    General: Skin is warm and dry.     Capillary Refill: Capillary refill takes less than 2 seconds.  Neurological:     Mental Status: He is alert. Mental status is at baseline.  Psychiatric:        Mood and Affect: Mood normal.        Behavior: Behavior normal.    ED Results / Procedures / Treatments   Labs (all labs ordered are listed, but only abnormal results are displayed) Labs Reviewed  GROUP A STREP BY PCR  RESP PANEL BY RT-PCR (FLU A&B, COVID) ARPGX2    EKG None  Radiology No results found.  Procedures Procedures   Medications Ordered in ED Medications  acetaminophen (TYLENOL) tablet 1,000 mg (1,000 mg Oral Given 02/07/21 2223)    ED Course  I have reviewed the triage vital signs and the nursing notes.  Pertinent labs & imaging results that were available during my care of the patient were reviewed by me and considered in my medical decision making (see chart for details).    MDM Rules/Calculators/A&P                           Patient is a well-appearing 20 year old male Physical exam unremarkable Complaining of symptoms consistent with a viral URI Overall quite reassuring vital signs within normal limits.  COVID influenza test negative strep negative.  Patient discharged home.  SPO2 100% on room air.  Return precautions given and conservative therapy recommended.  Frank Richard was evaluated in Emergency Department on 02/08/2021 for the symptoms described in the history of present illness. He was evaluated in the context of the global COVID-19 pandemic, which necessitated consideration that the patient might be at risk for infection with  the SARS-CoV-2 virus that causes COVID-19. Institutional protocols and algorithms that pertain to the evaluation of patients at risk for COVID-19 are in a state of rapid change based on information released by regulatory bodies including the CDC and federal and state organizations. These policies and algorithms were followed during the patient's care in the ED.   Final Clinical Impression(s) / ED Diagnoses Final diagnoses:  Viral pharyngitis  Viral URI    Rx / DC Orders ED Discharge Orders          Ordered    fluticasone (FLONASE) 50 MCG/ACT nasal spray  Daily  02/07/21 2217    benzonatate (TESSALON) 100 MG capsule  Every 8 hours        02/07/21 2217    promethazine-dextromethorphan (PROMETHAZINE-DM) 6.25-15 MG/5ML syrup  4 times daily PRN        02/07/21 2217    fluticasone (FLONASE) 50 MCG/ACT nasal spray  Daily        02/07/21 2217             Gailen Shelter, [place] 02/08/21 0010    Pollyann Savoy, MD 02/08/21 [phone]

## 2021-10-11 IMAGING — CR DG CHEST 2V
2 series · 2 of 2 positions shown · non-contrast
Comparison: None.

CLINICAL DATA: Chest pain.

EXAM:
CHEST - 2 VIEW

[w chest pa]
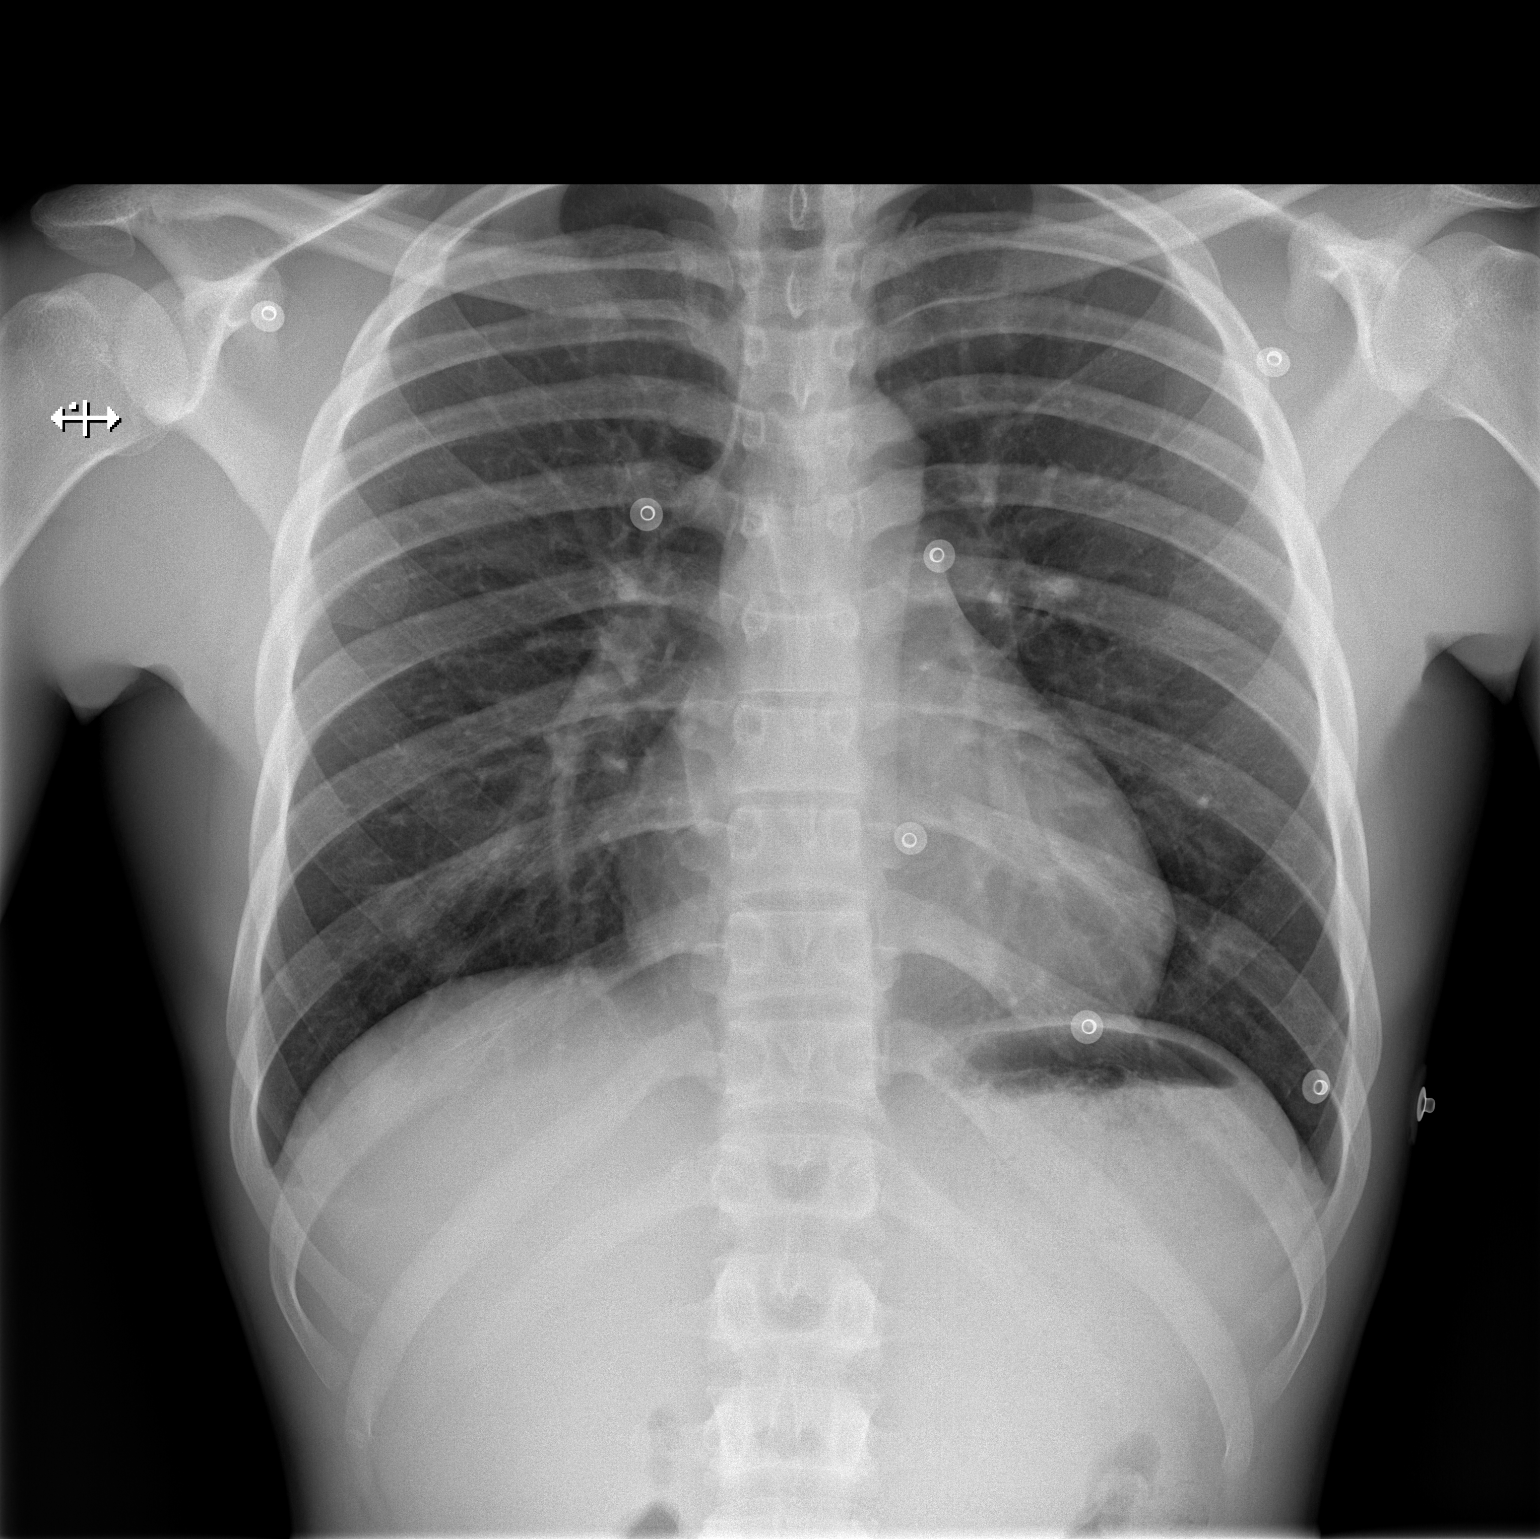

[w chest lat]
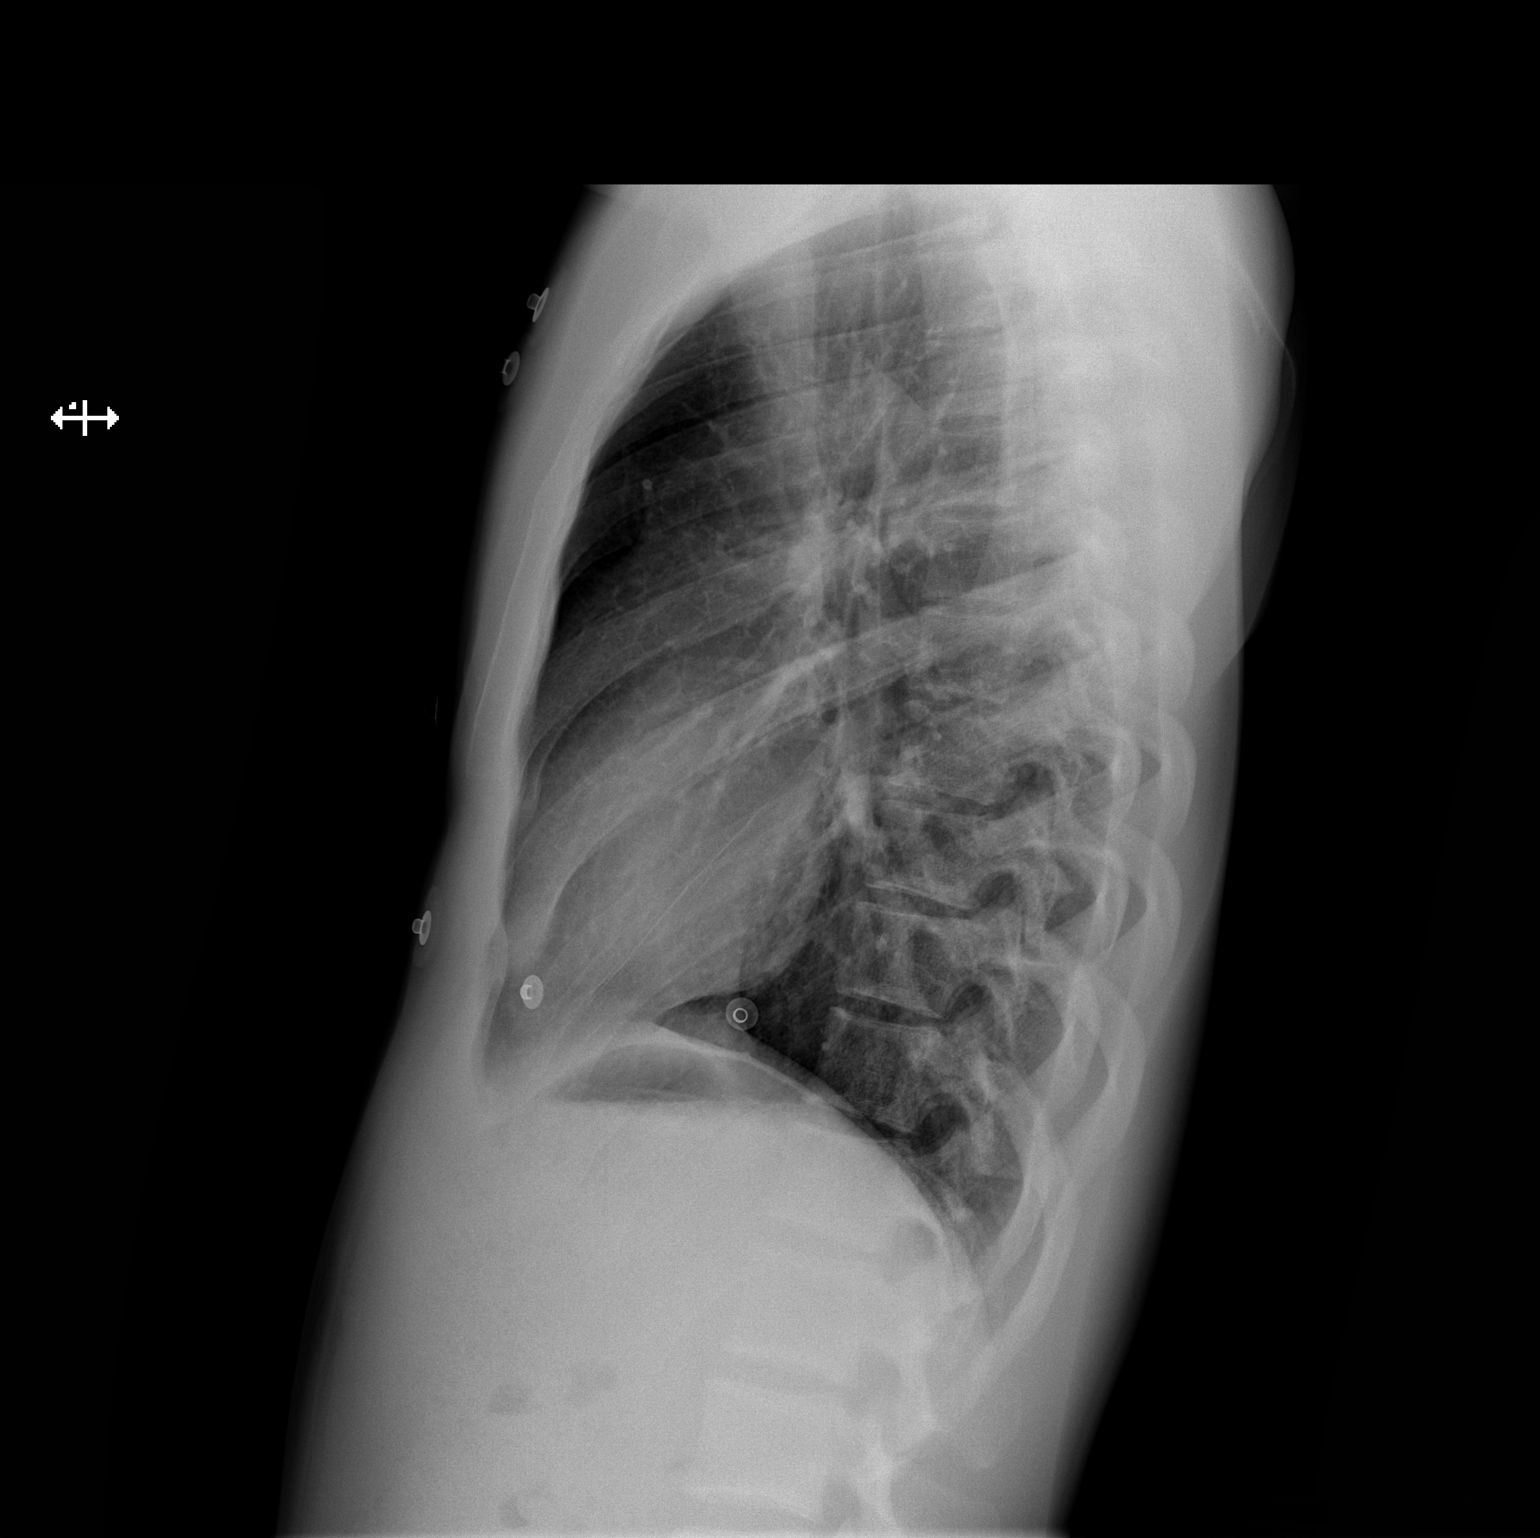

[2 of 2 positions shown; findings below may reference images not displayed]

FINDINGS: The heart size and mediastinal contours are within normal limits.
Both lungs are clear. No pneumothorax or pleural effusion is noted.
The visualized skeletal structures are unremarkable.
IMPRESSION: No active cardiopulmonary disease.
